# Patient Record
Sex: Male | Born: 2000 | Race: Black or African American | Hispanic: No | Marital: Single | Smoking: Never smoker
Health system: Southern US, Community
[De-identification: ages and names within clinical notes are randomized; demographics above are authoritative.]

## PROBLEM LIST (undated history)

## (undated) HISTORY — PX: FRACTURE SURGERY: SHX138

---
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
## 2019-01-16 ENCOUNTER — Emergency Department (HOSPITAL_COMMUNITY): Payer: BC Managed Care – PPO

## 2019-01-16 ENCOUNTER — Other Ambulatory Visit: Payer: Self-pay

## 2019-01-16 ENCOUNTER — Emergency Department (HOSPITAL_COMMUNITY)
Admission: EM | Admit: 2019-01-16 | Discharge: 2019-01-16 | Disposition: A | Discharge status: Home / Self Care | Payer: BC Managed Care – PPO | Attending: Emergency Medicine | Admitting: Emergency Medicine

## 2019-01-16 ENCOUNTER — Encounter (HOSPITAL_COMMUNITY): Payer: Self-pay | Admitting: *Deleted

## 2019-01-16 DIAGNOSIS — Y92410 Unspecified street and highway as the place of occurrence of the external cause: Secondary | ICD-10-CM | POA: Diagnosis not present

## 2019-01-16 DIAGNOSIS — M25561 Pain in right knee: Secondary | ICD-10-CM | POA: Insufficient documentation

## 2019-01-16 DIAGNOSIS — Y9389 Activity, other specified: Secondary | ICD-10-CM | POA: Diagnosis not present

## 2019-01-16 DIAGNOSIS — Y999 Unspecified external cause status: Secondary | ICD-10-CM | POA: Diagnosis not present

## 2019-01-16 MED ORDER — MELOXICAM 15 MG PO TABS: 15.0000 mg | ORAL_TABLET | Freq: Every day | ORAL | 0 refills | Status: AC

## 2019-01-16 MED ORDER — CYCLOBENZAPRINE HCL 10 MG PO TABS: 10.0000 mg | ORAL_TABLET | Freq: Two times a day (BID) | ORAL | 0 refills | Status: AC | PRN
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
## 2019-01-16 NOTE — Discharge Instructions (Addendum)
You were given a prescription for Flexeril which is a muscle relaxer.  You should not drive, work, consume alcohol, or operate machinery while taking this medication as it can make you very drowsy.    Your examination today is most concerning for a cervical strain 1. Medications: use tylenol and mobic for pain during the day. Flexeril at night or as needed. 2. Treatment: rest, ice, elevate and use brace, drink plenty of fluids, gentle stretching 3. Follow Up: If your symptoms do not improve please follow up with orthopedics/sports medicine or your PCP for discussion of your diagnoses and further evaluation after today's visit; if you do not have a primary care doctor use the resource guide provided to find one; Please return to the ER for worsening symptoms or other concerns.

## 2019-08-10 ENCOUNTER — Emergency Department (HOSPITAL_COMMUNITY)
Admission: EM | Admit: 2019-08-10 | Discharge: 2019-08-11 | Disposition: A | Discharge status: Home / Self Care | Payer: No Typology Code available for payment source | Attending: Emergency Medicine | Admitting: Emergency Medicine

## 2019-08-10 ENCOUNTER — Other Ambulatory Visit: Payer: Self-pay

## 2019-08-10 ENCOUNTER — Encounter (HOSPITAL_COMMUNITY): Payer: Self-pay | Admitting: Emergency Medicine

## 2019-08-10 ENCOUNTER — Emergency Department (HOSPITAL_COMMUNITY): Payer: No Typology Code available for payment source

## 2019-08-10 DIAGNOSIS — M25561 Pain in right knee: Secondary | ICD-10-CM | POA: Diagnosis not present

## 2019-08-10 DIAGNOSIS — Y990 Civilian activity done for income or pay: Secondary | ICD-10-CM | POA: Insufficient documentation

## 2019-08-10 DIAGNOSIS — M546 Pain in thoracic spine: Secondary | ICD-10-CM | POA: Diagnosis not present

## 2019-08-10 DIAGNOSIS — Y9389 Activity, other specified: Secondary | ICD-10-CM | POA: Diagnosis not present

## 2019-08-10 DIAGNOSIS — Y9259 Other trade areas as the place of occurrence of the external cause: Secondary | ICD-10-CM | POA: Insufficient documentation

## 2019-08-10 DIAGNOSIS — W19XXXA Unspecified fall, initial encounter: Secondary | ICD-10-CM

## 2019-08-10 DIAGNOSIS — W11XXXA Fall on and from ladder, initial encounter: Secondary | ICD-10-CM | POA: Insufficient documentation

## 2019-08-10 NOTE — ED Provider Notes (Signed)
[facility] EMERGENCY DEPARTMENT Provider Note   CSN: [id number] Arrival date & time: 08/10/19  2147 Jacob Mendoza is a 19 y.o. male with no pertinent past medical history who presents today for evaluation after a fall.  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  He denies any syncopal event or prodromal symptoms, this was a mechanical fall.  He reports that he fell and was able to partially catch himself on the ladder.  He denies striking his head.  No pain in his head or his neck.  He states that he took most of the impact on his right knee/leg.  He is able to ambulate on the right knee however reports significant pain.  His pain is worse with movement and touch.  He denies any weakness numbness or tingling.  No pain in the right hip.  Additionally he reports mild pain in his mid and lower back.  He denies any changes to bowel or bladder function.  No saddle anesthesia or paresthesia.  He also reports that he has a wound on his right forearm.  He states he his last tetanus shot was within the past 5 years.   HPI   History reviewed. No pertinent past medical history.  There are no problems to display for this patient.        Past Surgical History:  Procedure Laterality Date  . FRACTURE SURGERY     L elbow                 No family history on file.  Social History       Tobacco Use  . Smoking status: Never Smoker  Substance Use Topics  . Alcohol use: No  . Drug use: No    Home Medications        Prior to Admission medications   Medication Sig Start Date End Date Taking? Authorizing Provider  cyclobenzaprine (FLEXERIL) 10 MG tablet Take 1 tablet (10 mg total) by mouth 2 (two) times daily as needed for muscle spasms. 01/16/19   Gailen Shelter, PA  ibuprofen (ADVIL,MOTRIN) 200 MG tablet Take 400 mg by mouth every 6 (six) hours as needed for moderate pain.    [provider]  ondansetron (ZOFRAN) 4 MG  tablet Take 1 tablet (4 mg total) by mouth every 8 (eight) hours as needed for nausea or vomiting. 07/18/14   Fayrene Helper, PA-C    Allergies            Patient has no known allergies.  Review of Systems   Review of Systems  Constitutional: Negative for chills and fever.  HENT: Negative for congestion.   Respiratory: Negative for chest tightness and shortness of breath.   Cardiovascular: Negative for chest pain.  Gastrointestinal: Negative for abdominal pain, nausea and vomiting.  Musculoskeletal: Positive for back pain.  Skin: Positive for wound.  Neurological: Negative for weakness and headaches.  All other systems reviewed and are negative.   Physical Exam Updated Vital Signs BP (!) 142/82 (BP Location: Right Arm)   Pulse 76   Temp 98.1 F (36.7 C) (Oral)   Resp 17   Ht 5\' 11"  (1.803 m)   Wt 90 kg   SpO2 100%   BMI 27.67 kg/m   Physical Exam Vitals and nursing note reviewed.  Constitutional:      General: He is not in acute distress. HENT:     Head: Normocephalic and atraumatic.  Pulmonary:  Effort: Pulmonary effort is normal. No respiratory distress.  Abdominal:     General: There is no distension.     Tenderness: There is no abdominal tenderness.  Musculoskeletal:     Comments: Right knee has mild edema.  Knee is tender primarily on the superior aspect worse medially than laterally.  There is no significant deformity.  Range of motion with flexion is limited secondary to pain however strength is intact.  Knee is grossly stable to valgus/varus stress and anterior/posterior drawer test.  No significant knee joint effusion.  There is mild diffuse midline tenderness palpation over the upper lumbar/lower thoracic spine with mild paraspinal muscle tenderness.  No palpable step-offs, crepitus, or deformities.  Skin:    General: Skin is warm and dry.     Comments: There is a approximately 5 cm superficial laceration on the right forearm.  Laceration is not fully  through the dermis, does not gape.  Is well approximated without significant active bleeding.  Neurological:     General: No focal deficit present.     Mental Status: He is alert.  Psychiatric:        Mood and Affect: Mood normal.        Behavior: Behavior normal.          History Chief Complaint  Patient presents with  . Knee Pain    ED Results / Procedures / Treatments   Labs (all labs ordered are listed, but only abnormal results are displayed) Labs Reviewed - No data to display  EKG None  Radiology DG Thoracic Spine 2 View  Result Date: 08/10/2019 CLINICAL DATA:  Larey Seat, right-sided back pain radiating to right lower extremity EXAM: THORACIC SPINE 2 VIEWS COMPARISON:  None. FINDINGS: Frontal and lateral views of the thoracic spine demonstrate no fractures. Alignment is anatomic. Disc spaces are well preserved. Soft tissues are normal. IMPRESSION: 1. Unremarkable thoracic spine. Electronically Signed   By: Sharlet Salina M.D.   On: 08/10/2019 23:28   DG Lumbar Spine Complete  Result Date: 08/10/2019 CLINICAL DATA:  Larey Seat, right-sided back pain radiating to right lower extremity EXAM: LUMBAR SPINE - COMPLETE 4+ VIEW COMPARISON:  None. FINDINGS: Frontal, bilateral oblique, lateral views of the lumbar spine demonstrate 5 non-rib-bearing lumbar type vertebral bodies in grossly normal alignment. There are no acute fractures. Disc spaces are well preserved. Sacroiliac joints are normal. IMPRESSION: 1. Unremarkable lumbar spine. Electronically Signed   By: Sharlet Salina M.D.   On: 08/10/2019 23:27   DG Knee Complete 4 Views Right  Result Date: 08/10/2019 CLINICAL DATA:  Larey Seat, right knee pain EXAM: RIGHT KNEE - COMPLETE 4+ VIEW COMPARISON:  01/16/2019 FINDINGS: Frontal, bilateral oblique, lateral views of the right knee are obtained. No fracture, subluxation, or dislocation. Joint spaces are relatively well preserved. Chronic enthesopathic changes upper pole of the patella. No joint  effusion. IMPRESSION: 1. Unremarkable right knee. Electronically Signed   By: Sharlet Salina M.D.   On: 08/10/2019 22:24    Procedures Procedures (including critical care time)  Medications Ordered in ED Medications - No data to display  ED Course  I have reviewed the triage vital signs and the nursing notes.  Pertinent labs & imaging results that were available during my care of the patient were reviewed by me and considered in my medical decision making (see chart for details).  Clinical Course as of Aug 10 236  Tue Aug 10, 2019  2231 Attempted to see patient, not in room.    [EH]  Clinical Course User Index [EH] Ollen Gross   MDM Rules/Calculators/A&P                     Patient is a 19 year old otherwise healthy man who presents today for evaluation after mechanical nonsyncopal fall.  He reports his only injuries are his back, right knee and right forearm, denies any other injuries or striking his head. On exam his right knee has mild edema however is grossly intact.  Her range of motion is limited secondary to pain however he is neurovascularly intact.  He is given crutches, recommended rice and supportive care.  X-rays without fracture or significant joint effusion.  He has diffuse midline tenderness through upper L and lower T-spine without crepitus or deformities.  No changes to bowel/bladder function.  No saddle anesthesias or paresthesias.  X-rays were obtained without evidence of fracture, dislocation, subluxation, compression, or other acute abnormalities.  Suspect strain due to fall.  Conservative care discussed.  The laceration that he has on his right forearm is very superficial, does not go through the entire depth of the dermis.  He reports his tetanus is up-to-date.  No indication for repair at this time.  Laceration care discussed.  Return precautions were discussed with patient who states their understanding.  At the time of discharge patient  denied any unaddressed complaints or concerns.  Patient is agreeable for discharge home.  Note: Portions of this report may have been transcribed using voice recognition software. Every effort was made to ensure accuracy; however, inadvertent computerized transcription errors may be present  Final Clinical Impression(s) / ED Diagnoses Final diagnoses:  Fall, initial encounter  Acute pain of right knee  Acute midline thoracic back pain    Rx / DC Orders ED Discharge Orders    None       Lorin Glass, PA-C 08/11/19 3546    Tegeler, Gwenyth Allegra, MD 08/11/19 1626

## 2019-08-10 NOTE — ED Notes (Signed)
Patient transported to XR. 

## 2019-08-10 NOTE — ED Triage Notes (Signed)
Patient lost his balance and fell at work this afternoon , no LOC/ambulatory , reports right knee pain with mild swelling .

## 2019-08-11 NOTE — Discharge Instructions (Signed)

## 2021-07-16 ENCOUNTER — Emergency Department (HOSPITAL_COMMUNITY): Payer: BC Managed Care – PPO

## 2021-07-16 ENCOUNTER — Emergency Department (HOSPITAL_COMMUNITY)
Admission: EM | Admit: 2021-07-16 | Discharge: 2021-07-16 | Disposition: A | Discharge status: Home / Self Care | Payer: BC Managed Care – PPO | Attending: Emergency Medicine | Admitting: Emergency Medicine

## 2021-07-16 ENCOUNTER — Encounter (HOSPITAL_COMMUNITY): Payer: Self-pay

## 2021-07-16 ENCOUNTER — Other Ambulatory Visit: Payer: Self-pay

## 2021-07-16 DIAGNOSIS — Z20822 Contact with and (suspected) exposure to covid-19: Secondary | ICD-10-CM | POA: Insufficient documentation

## 2021-07-16 DIAGNOSIS — R1084 Generalized abdominal pain: Secondary | ICD-10-CM | POA: Insufficient documentation

## 2021-07-16 DIAGNOSIS — B349 Viral infection, unspecified: Secondary | ICD-10-CM | POA: Insufficient documentation

## 2021-07-16 DIAGNOSIS — M791 Myalgia, unspecified site: Secondary | ICD-10-CM | POA: Diagnosis present

## 2021-07-16 DIAGNOSIS — R112 Nausea with vomiting, unspecified: Secondary | ICD-10-CM

## 2021-07-16 LAB — CBC WITH DIFFERENTIAL/PLATELET
Abs Immature Granulocytes: 0.01 10*3/uL (ref 0.00–0.07)
Basophils Absolute: 0 10*3/uL (ref 0.0–0.1)
Basophils Relative: 0 %
Eosinophils Absolute: 0 10*3/uL (ref 0.0–0.5)
Eosinophils Relative: 0 %
HCT: 44.3 % (ref 39.0–52.0)
Hemoglobin: 15.4 g/dL (ref 13.0–17.0)
Immature Granulocytes: 0 %
Lymphocytes Relative: 15 %
Lymphs Abs: 0.8 10*3/uL (ref 0.7–4.0)
MCH: 32.2 pg (ref 26.0–34.0)
MCHC: 34.8 g/dL (ref 30.0–36.0)
MCV: 92.7 fL (ref 80.0–100.0)
Monocytes Absolute: 0.6 10*3/uL (ref 0.1–1.0)
Monocytes Relative: 13 %
Neutro Abs: 3.7 10*3/uL (ref 1.7–7.7)
Neutrophils Relative %: 72 %
Platelets: 130 10*3/uL — ABNORMAL LOW (ref 150–400)
RBC: 4.78 MIL/uL (ref 4.22–5.81)
RDW: 11.8 % (ref 11.5–15.5)
WBC: 5.1 10*3/uL (ref 4.0–10.5)
nRBC: 0 % (ref 0.0–0.2)

## 2021-07-16 LAB — URINALYSIS, ROUTINE W REFLEX MICROSCOPIC
Bacteria, UA: NONE SEEN
Bilirubin Urine: NEGATIVE
Glucose, UA: NEGATIVE mg/dL
Hgb urine dipstick: NEGATIVE
Ketones, ur: NEGATIVE mg/dL
Nitrite: NEGATIVE
Protein, ur: 30 mg/dL — AB
Specific Gravity, Urine: 1.032 — ABNORMAL HIGH (ref 1.005–1.030)
pH: 5 (ref 5.0–8.0)

## 2021-07-16 LAB — COMPREHENSIVE METABOLIC PANEL
ALT: 14 U/L (ref 0–44)
AST: 20 U/L (ref 15–41)
Albumin: 4.1 g/dL (ref 3.5–5.0)
Alkaline Phosphatase: 53 U/L (ref 38–126)
Anion gap: 8 (ref 5–15)
BUN: 11 mg/dL (ref 6–20)
CO2: 25 mmol/L (ref 22–32)
Calcium: 8.8 mg/dL — ABNORMAL LOW (ref 8.9–10.3)
Chloride: 105 mmol/L (ref 98–111)
Creatinine, Ser: 0.93 mg/dL (ref 0.61–1.24)
GFR, Estimated: >60 mL/min (ref >60)
Glucose, Bld: 109 mg/dL — ABNORMAL HIGH (ref 70–99)
Potassium: 3.5 mmol/L (ref 3.5–5.1)
Sodium: 138 mmol/L (ref 135–145)
Total Bilirubin: 0.8 mg/dL (ref 0.3–1.2)
Total Protein: 7.5 g/dL (ref 6.5–8.1)

## 2021-07-16 LAB — LIPASE, BLOOD: Lipase: 25 U/L (ref 11–51)

## 2021-07-16 LAB — RESP PANEL BY RT-PCR (FLU A&B, COVID) ARPGX2
Influenza A by PCR: NEGATIVE
Influenza B by PCR: NEGATIVE
SARS Coronavirus 2 by RT PCR: NEGATIVE

## 2021-07-16 LAB — CK: Total CK: 137 U/L (ref 49–397)

## 2021-07-16 MED ORDER — ONDANSETRON 4 MG PO TBDP: 4.0000 mg | ORAL_TABLET | Freq: Three times a day (TID) | ORAL | 0 refills | Status: AC | PRN

## 2021-07-16 MED ORDER — IOHEXOL 300 MG/ML  SOLN
100.0000 mL | Freq: Once | INTRAMUSCULAR | Status: AC | PRN
  Administered 2021-07-16: 100 mL via INTRAVENOUS

## 2021-07-16 MED ORDER — SODIUM CHLORIDE 0.9 % IV BOLUS
1000.0000 mL | Freq: Once | INTRAVENOUS | Status: AC
  Administered 2021-07-16: 1000 mL via INTRAVENOUS

## 2021-07-16 MED ORDER — LOPERAMIDE HCL 2 MG PO CAPS: 2.0000 mg | ORAL_CAPSULE | Freq: Four times a day (QID) | ORAL | 0 refills | Status: AC | PRN

## 2021-07-16 MED ORDER — NAPROXEN 500 MG PO TABS: 500.0000 mg | ORAL_TABLET | Freq: Two times a day (BID) | ORAL | 0 refills | Status: AC

## 2021-07-16 MED ORDER — ACETAMINOPHEN 325 MG PO TABS
650.0000 mg | ORAL_TABLET | Freq: Once | ORAL | Status: AC
  Administered 2021-07-16: 650 mg via ORAL
  Filled 2021-07-16: qty 2

## 2021-07-16 MED ORDER — MORPHINE SULFATE (PF) 4 MG/ML IV SOLN
4.0000 mg | Freq: Once | INTRAVENOUS | Status: AC
  Administered 2021-07-16: 4 mg via INTRAVENOUS
  Filled 2021-07-16: qty 1

## 2021-07-16 MED ORDER — ONDANSETRON HCL 4 MG/2ML IJ SOLN
4.0000 mg | Freq: Once | INTRAMUSCULAR | Status: AC
  Administered 2021-07-16: 4 mg via INTRAVENOUS
  Filled 2021-07-16: qty 2

## 2021-07-16 NOTE — ED Triage Notes (Signed)
Pt states he woke about 4am yesterday and had n/v/d and has had continued symptoms with body aches.  Temp of 99.  ?

## 2021-07-16 NOTE — Discharge Instructions (Addendum)
You likely have a viral illness that is causing your symptoms.  Have written you for a few medications to help.  Take as prescribed.  Make sure to rest, drink plenty of fluids.  Return for any new or worsening symptoms. ?

## 2021-07-16 NOTE — ED Provider Notes (Signed)
?[facility] DEPT ?Provider Note ? ? ?CSN: [id number] ?Arrival date & time: 07/16/21  0120 ? ?  ? ?History ? ?Chief Complaint  ?Patient presents with  ? Generalized Body Aches  ? ? ?Jacob Mendoza is a 21 y.o. male here for evaluation of feeling unwell.  Began around 4 AM yesterday.  Noted some generalized myalgias, nausea, vomiting, diarrhea, headache.  No congestion, rhinorrhea, cough.  No known sick contacts.  Feels generally weak.  NBNB emesis.  Has some generalized lower abdominal pain, left greater than right.  Feels like he has had a fever at home however has not taken his temperature.  No dysuria, hematuria.  No rashes or lesions.  No neck pain, neck stiffness, photophobia, phonophobia.  No Meds PTA. ? ?HPI ? ?  ? ?Home Medications ?Prior to Admission medications   ?Medication Sig Start Date End Date Taking? Authorizing Provider  ?loperamide (IMODIUM) 2 MG capsule Take 1 capsule (2 mg total) by mouth 4 (four) times daily as needed for diarrhea or loose stools. 07/16/21  Yes Kylani Wires A, PA-C  ?naproxen (NAPROSYN) 500 MG tablet Take 1 tablet (500 mg total) by mouth 2 (two) times daily. 07/16/21  Yes Danitra Payano A, PA-C  ?ondansetron (ZOFRAN-ODT) 4 MG disintegrating tablet Take 1 tablet (4 mg total) by mouth every 8 (eight) hours as needed for nausea or vomiting. 07/16/21  Yes Kadyn Guild A, PA-C  ?cyclobenzaprine (FLEXERIL) 10 MG tablet Take 1 tablet (10 mg total) by mouth 2 (two) times daily as needed for muscle spasms. 01/16/19   Tedd Sias, PA  ?ibuprofen (ADVIL,MOTRIN) 200 MG tablet Take 400 mg by mouth every 6 (six) hours as needed for moderate pain.    [provider]  ?ondansetron (ZOFRAN) 4 MG tablet Take 1 tablet (4 mg total) by mouth every 8 (eight) hours as needed for nausea or vomiting. 07/18/14   Domenic Moras, PA-C  ?   ? ?Allergies    ?Patient has no known allergies.   ? ?Review of Systems   ?Review of Systems  ?Constitutional:  Positive  for activity change, appetite change, fatigue (subjective) and fever.  ?HENT: Negative.    ?Respiratory: Negative.    ?Cardiovascular: Negative.   ?Gastrointestinal:  Positive for abdominal pain, diarrhea, nausea and vomiting. Negative for abdominal distention, anal bleeding, blood in stool, constipation and rectal pain.  ?Genitourinary: Negative.   ?Musculoskeletal:  Positive for myalgias. Negative for arthralgias, back pain, gait problem, joint swelling, neck pain and neck stiffness.  ?Skin: Negative.   ?Neurological:  Positive for headaches. Negative for dizziness, tremors, seizures, syncope, facial asymmetry, speech difficulty, weakness, light-headedness and numbness.  ?All other systems reviewed and are negative. ? ?Physical Exam ?Updated Vital Signs ?BP 140/85   Pulse 62   Temp 99.9 ?F (37.7 ?C) (Oral)   Resp 20   Ht 5\' 11"  (1.803 m)   Wt 97.5 kg   SpO2 95%   BMI 29.99 kg/m?  ?Physical Exam ?Vitals and nursing note reviewed.  ?Constitutional:   ?   General: He is not in acute distress. ?   Appearance: He is not ill-appearing, toxic-appearing or diaphoretic.  ?HENT:  ?   Head: Normocephalic and atraumatic.  ?   Jaw: There is normal jaw occlusion.  ?   Right Ear: Tympanic membrane, ear canal and external ear normal. There is no impacted cerumen. No hemotympanum. Tympanic membrane is not injected, scarred, perforated, erythematous, retracted or bulging.  ?   Left Ear: Tympanic  membrane, ear canal and external ear normal. There is no impacted cerumen. No hemotympanum. Tympanic membrane is not injected, scarred, perforated, erythematous, retracted or bulging.  ?   Ears:  ?   Comments: No Mastoid tenderness. ?   Nose:  ?   Comments: Clear rhinorrhea and congestion to bilateral nares.  No sinus tenderness. ?   Mouth/Throat:  ?   Comments: Posterior oropharynx clear.  Mucous membranes moist.  Tonsils without erythema or exudate.  Uvula midline without deviation.  No evidence of PTA or RPA.  No drooling,  dysphasia or trismus.  Phonation normal. ?Neck:  ?   Trachea: Trachea and phonation normal.  ?   Meningeal: Brudzinski's sign and Kernig's sign absent.  ?   Comments: No Neck stiffness or neck rigidity.  No meningismus.  No cervical lymphadenopathy. ?Cardiovascular:  ?   Rate and Rhythm: Normal rate.  ?   Pulses: Normal pulses.  ?   Heart sounds: Normal heart sounds.  ?   Comments: No murmurs rubs or gallops. ?Pulmonary:  ?   Effort: Pulmonary effort is normal.  ?   Breath sounds: Normal breath sounds.  ?   Comments: Clear to auscultation bilaterally without wheeze, rhonchi or rales.  No accessory muscle usage.  Able speak in full sentences. ?Abdominal:  ?   General: Bowel sounds are normal.  ?   Palpations: Abdomen is soft.  ?   Tenderness: There is abdominal tenderness. There is no right CVA tenderness, left CVA tenderness, guarding or rebound. Negative signs include Murphy's sign and McBurney's sign.  ?   Hernia: No hernia is present.  ?   Comments: Generalized abdominal tenderness, worse to lower abdomen.  No CVA tenderness.  ?Musculoskeletal:  ?   Cervical back: Normal range of motion.  ?   Comments: Moves all 4 extremities without difficulty.  Lower extremities without edema, erythema or warmth.  ?Skin: ?   Comments: Brisk capillary refill.  No rashes or lesions.  ?Neurological:  ?   Mental Status: He is alert.  ?   Comments: Ambulatory in department without difficulty.  Cranial nerves II through XII grossly intact.  No facial droop.  No aphasia.  ? ? ?ED Results / Procedures / Treatments   ?Labs ?(all labs ordered are listed, but only abnormal results are displayed) ?Labs Reviewed  ?CBC WITH DIFFERENTIAL/PLATELET - Abnormal; Notable for the following components:  ?    Result Value  ? Platelets 130 (*)   ? All other components within normal limits  ?COMPREHENSIVE METABOLIC PANEL - Abnormal; Notable for the following components:  ? Glucose, Bld 109 (*)   ? Calcium 8.8 (*)   ? All other components within normal  limits  ?URINALYSIS, ROUTINE W REFLEX MICROSCOPIC - Abnormal; Notable for the following components:  ? Specific Gravity, Urine 1.032 (*)   ? Protein, ur 30 (*)   ? Leukocytes,Ua SMALL (*)   ? All other components within normal limits  ?RESP PANEL BY RT-PCR (FLU A&B, COVID) [id number]  ?LIPASE, BLOOD  ?CK  ? ? ?EKG ?None ? ?Radiology ?CT ABDOMEN PELVIS W CONTRAST ? ?Result Date: 07/16/2021 ?CLINICAL DATA:  Body aches with nausea, vomiting and diarrhea. EXAM: CT ABDOMEN AND PELVIS WITH CONTRAST TECHNIQUE: Multidetector CT imaging of the abdomen and pelvis was performed using the standard protocol following bolus administration of intravenous contrast. RADIATION DOSE REDUCTION: This exam was performed according to the departmental dose-optimization program which includes automated exposure control, adjustment of the mA and/or kV according to  patient size and/or use of iterative reconstruction technique. CONTRAST:  163mL OMNIPAQUE IOHEXOL 300 MG/ML  SOLN COMPARISON:  None. FINDINGS: Lower chest: No acute abnormality. Hepatobiliary: No focal liver abnormality is seen. No gallstones, gallbladder wall thickening, or biliary dilatation. Pancreas: Unremarkable. No pancreatic ductal dilatation or surrounding inflammatory changes. Spleen: Normal in size without focal abnormality. Adrenals/Urinary Tract: Adrenal glands are unremarkable. Kidneys are normal, without renal calculi, focal lesion, or hydronephrosis. Bladder is unremarkable. Stomach/Bowel: Stomach is within normal limits. Appendix appears normal. No evidence of bowel wall thickening, distention, or inflammatory changes. Vascular/Lymphatic: No significant vascular findings are present. Multiple subcentimeter para-aortic lymph nodes are seen. Reproductive: Prostate is unremarkable. Other: No abdominal wall hernia or abnormality. No abdominopelvic ascites. Musculoskeletal: No acute or significant osseous findings. IMPRESSION: No acute or active process within the abdomen or  pelvis. Electronically Signed   By: Virgina Norfolk M.D.   On: 07/16/2021 04:05   ? ?Procedures ?Procedures  ? ? ?Medications Ordered in ED ?Medications  ?ondansetron (ZOFRAN) injection 4 mg (4 mg Intravenous Given

## 2021-07-16 NOTE — ED Notes (Signed)
Discharge instructions reviewed, questions answered. Rx education provided - pt aware that he can buy antidiarrheal and pain medications OTC as well. Pt states understanding and no further questions. Pt ambulatory with steady gait upon discharge. No s/s of distress noted. ? ?

## 2023-07-25 IMAGING — CT CT ABD-PELV W/ CM
2 of 4 series · 16 of 46 positions shown, 18 images · IV contrast (agent unspecified)
Comparison: None.

CLINICAL DATA: Body aches with nausea, vomiting and diarrhea.

EXAM:
CT ABDOMEN AND PELVIS WITH CONTRAST
TECHNIQUE: Multidetector CT imaging of the abdomen and pelvis was performed
using the standard protocol following bolus administration of
intravenous contrast.

[Series 2: axial st · axial · 0.81mm/px · z∈[+1192,+1622]mm · 13 of 98 slices shown, 15 images]
[im 6/98  soft-tissue]
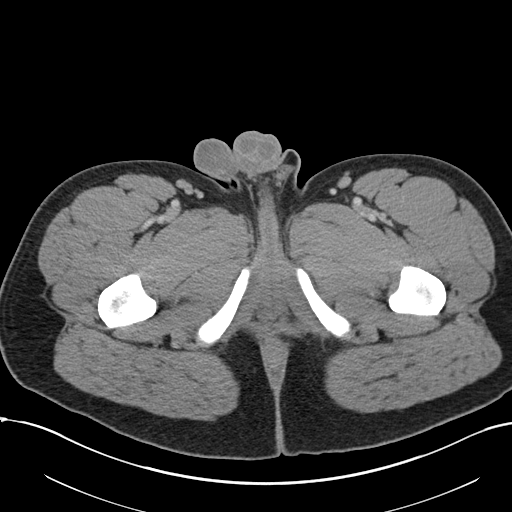
[im 6/98  bone]
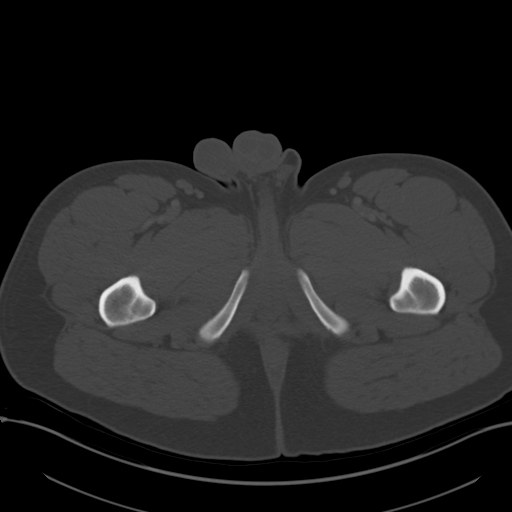
[im 12/98  soft-tissue]
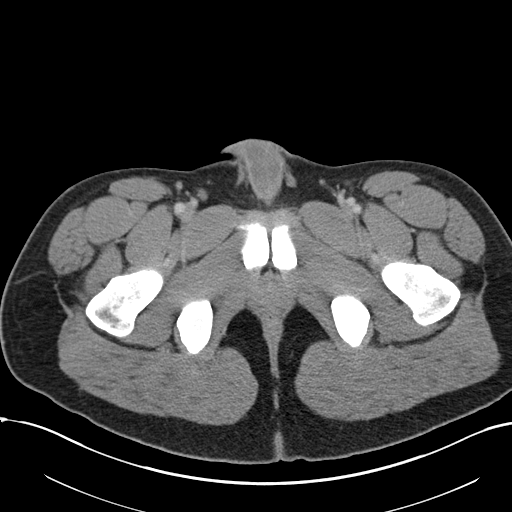
[im 23/98  soft-tissue]
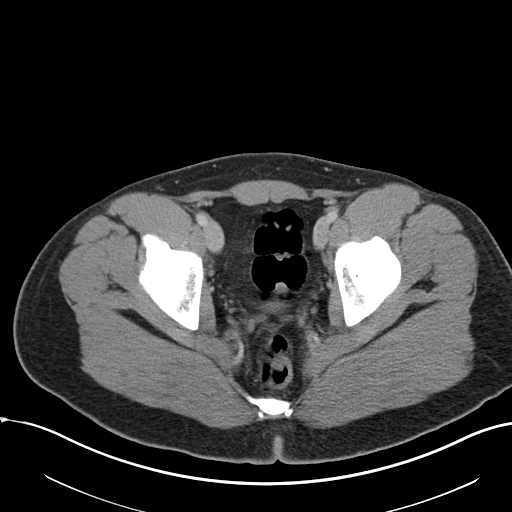
[im 29/98  soft-tissue]
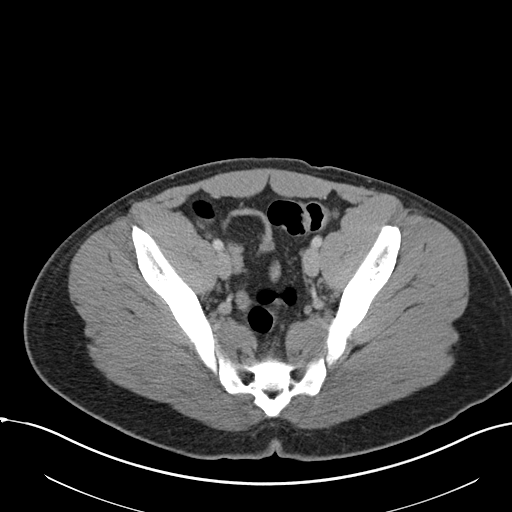
[im 35/98  soft-tissue]
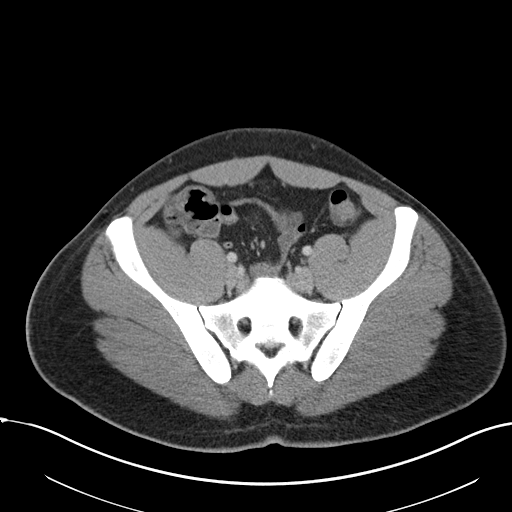
[im 40/98  soft-tissue]
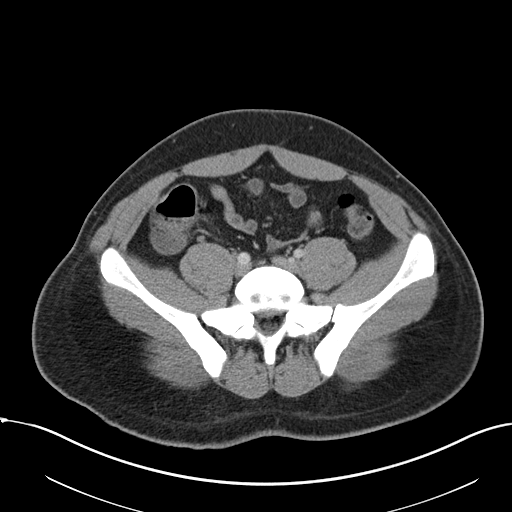
[im 52/98  soft-tissue]
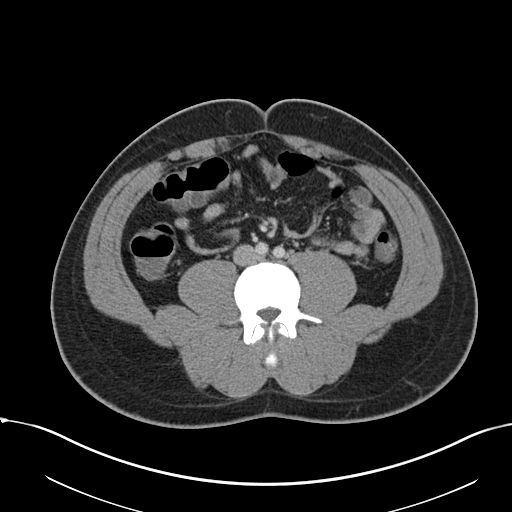
[im 58/98  soft-tissue]
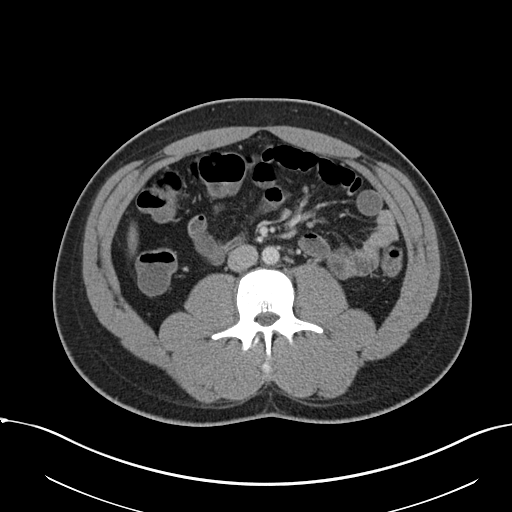
[im 63/98  soft-tissue]
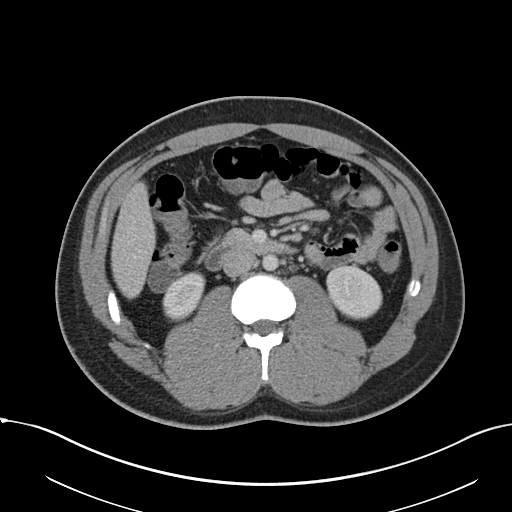
[im 63/98  bone]
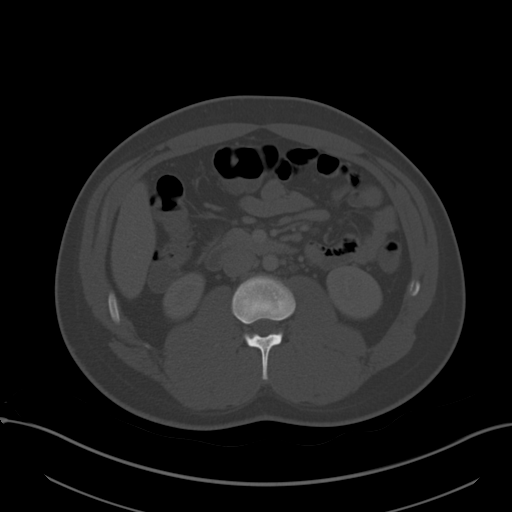
[im 69/98  soft-tissue]
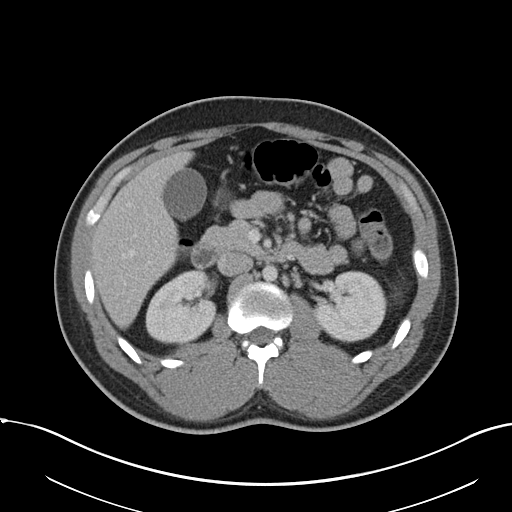
[im 75/98  soft-tissue]
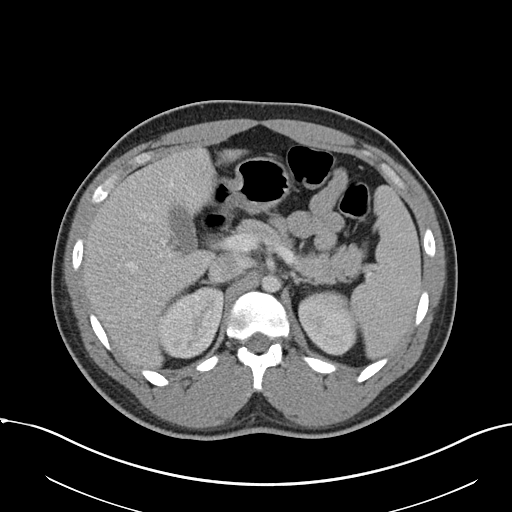
[im 86/98  soft-tissue]
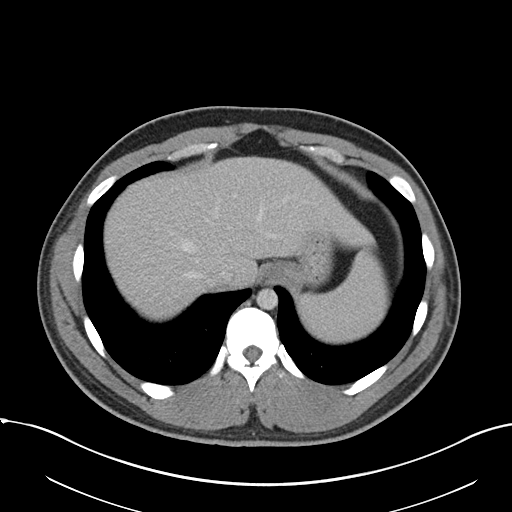
[im 92/98  soft-tissue]
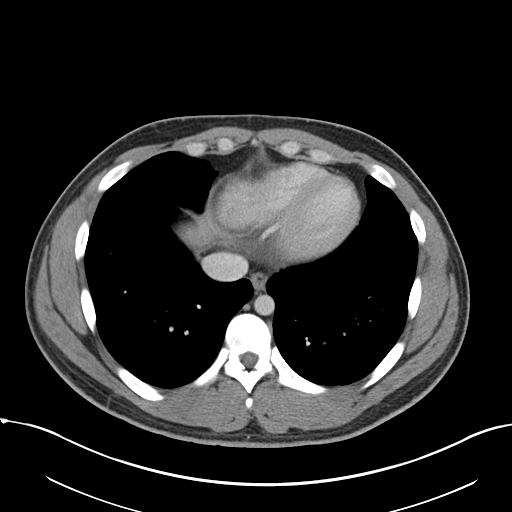

[Series 5: coronal st · coronal · 0.78mm/px · 3 of 157 slices shown]
[im 53/157  soft-tissue]
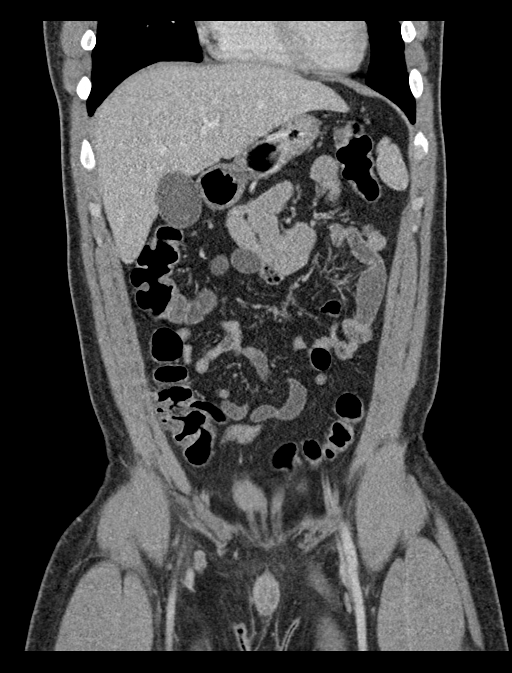
[im 70/157  soft-tissue]
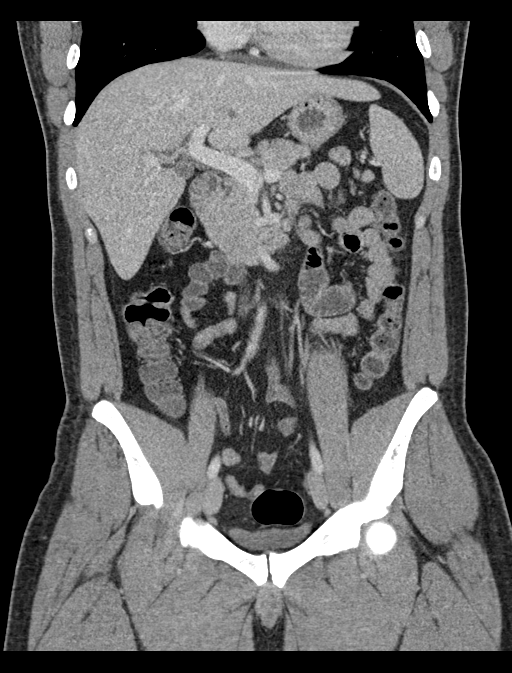
[im 87/157  soft-tissue]
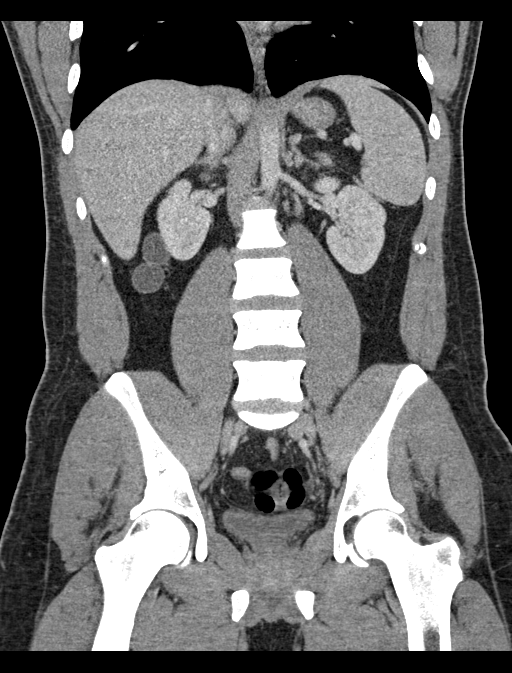

[16 of 46 positions shown; findings below may reference images not displayed]

RADIATION DOSE REDUCTION: This exam was performed according to the
departmental dose-optimization program which includes automated
exposure control, adjustment of the mA and/or kV according to
patient size and/or use of iterative reconstruction technique.

CONTRAST:  100mL OMNIPAQUE IOHEXOL 300 MG/ML  SOLN
FINDINGS: Lower chest: No acute abnormality.

Hepatobiliary: No focal liver abnormality is seen. No gallstones,
gallbladder wall thickening, or biliary dilatation.

Pancreas: Unremarkable. No pancreatic ductal dilatation or
surrounding inflammatory changes.

Spleen: Normal in size without focal abnormality.

Adrenals/Urinary Tract: Adrenal glands are unremarkable. Kidneys are
normal, without renal calculi, focal lesion, or hydronephrosis.
Bladder is unremarkable.

Stomach/Bowel: Stomach is within normal limits. Appendix appears
normal. No evidence of bowel wall thickening, distention, or
inflammatory changes.

Vascular/Lymphatic: No significant vascular findings are present.
Multiple subcentimeter para-aortic lymph nodes are seen.

Reproductive: Prostate is unremarkable.

Other: No abdominal wall hernia or abnormality. No abdominopelvic
ascites.

Musculoskeletal: No acute or significant osseous findings.
IMPRESSION: No acute or active process within the abdomen or pelvis.
